# Patient Record
Sex: Male | Born: 1965 | Race: Black or African American | Hispanic: No | Marital: Single | Smoking: Never smoker
Health system: Southern US, Community
[De-identification: ages and names within clinical notes are randomized; demographics above are authoritative.]

---

## 2004-12-19 ENCOUNTER — Ambulatory Visit: Payer: Self-pay | Admitting: Unknown Physician Specialty

## 2007-05-22 ENCOUNTER — Emergency Department: Payer: Self-pay | Admitting: Emergency Medicine

## 2008-01-31 ENCOUNTER — Encounter: Admission: RE | Admit: 2008-01-31 | Discharge: 2008-01-31 | Payer: Self-pay | Admitting: Family Medicine

## 2009-02-20 ENCOUNTER — Emergency Department: Payer: Self-pay | Admitting: Emergency Medicine

## 2011-06-19 ENCOUNTER — Emergency Department: Payer: Self-pay | Admitting: Emergency Medicine

## 2011-07-01 ENCOUNTER — Ambulatory Visit: Payer: Self-pay | Admitting: Specialist

## 2012-06-19 IMAGING — CR DG SHOULDER 3+V*R*
1 series · 3 of 3 positions shown · non-contrast
Comparison: none

REASON FOR EXAM: pain x 6 months     Flex 5
COMMENTS:   LMP: (Male)

PROCEDURE:     DXR - DXR SHOULDER RIGHT COMPLETE  - June 19, 2011 [DATE]
RESULT:     Degenerative change present about the right shoulder.
Subacromial spurring is present. There is no evidence of fracture.

[Series 1: w shoulder external right · 0.14mm/px · 3 of 3 slices shown]
[im 1/3]
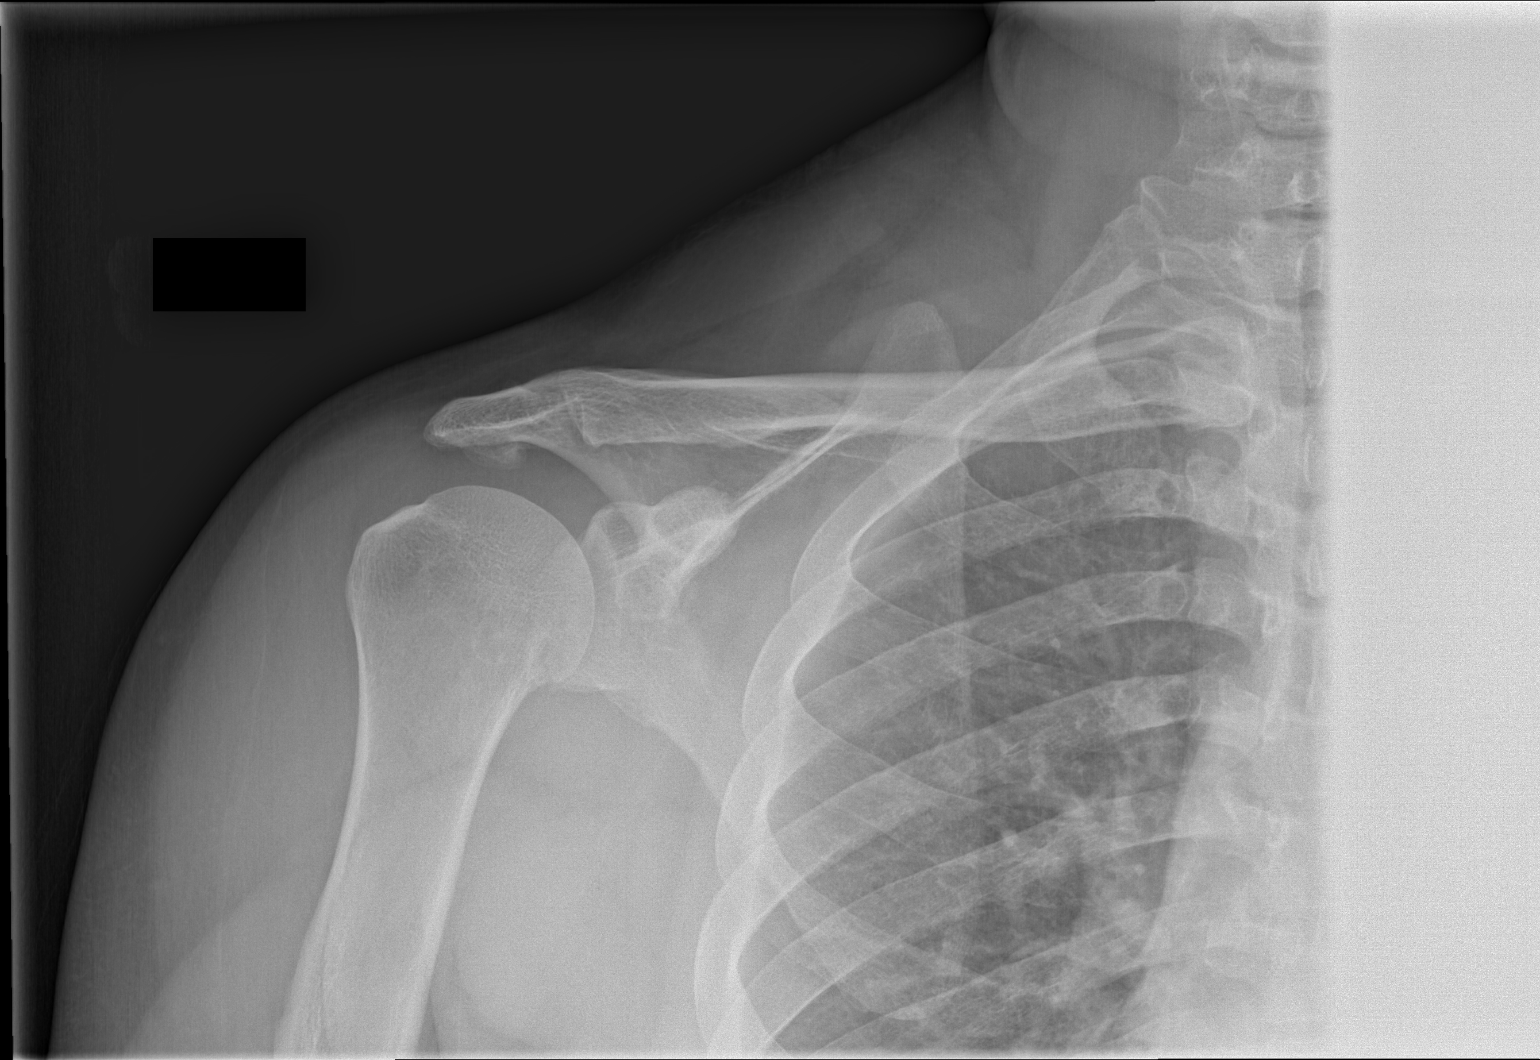
[im 2/3]
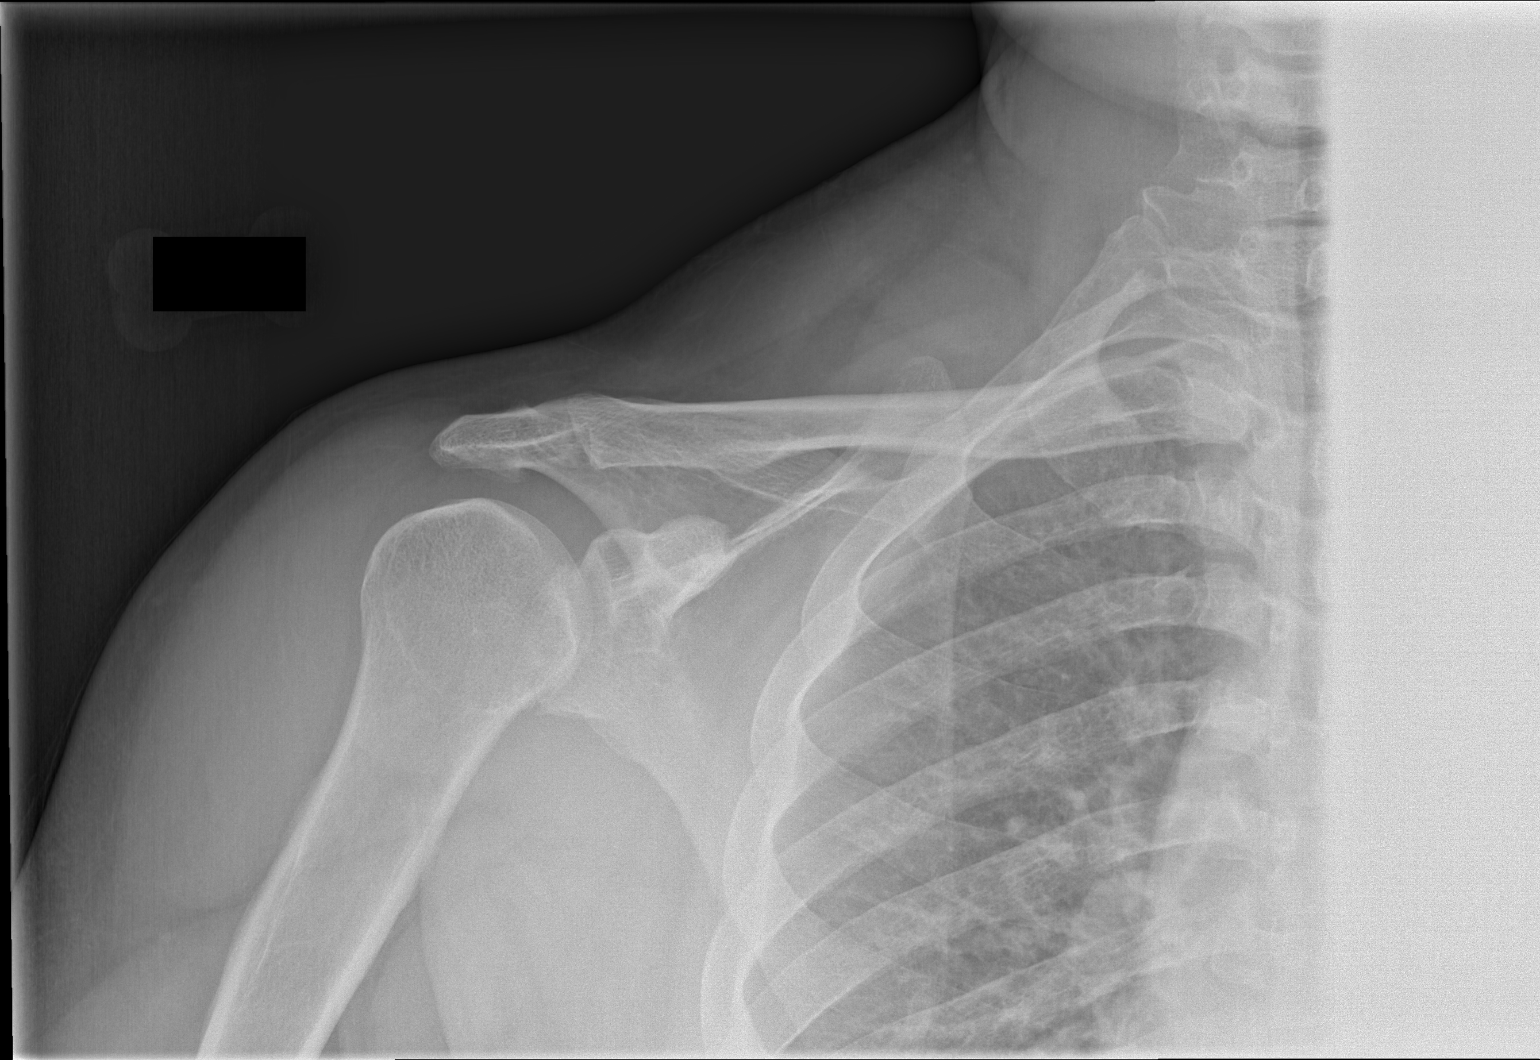
[im 3/3]
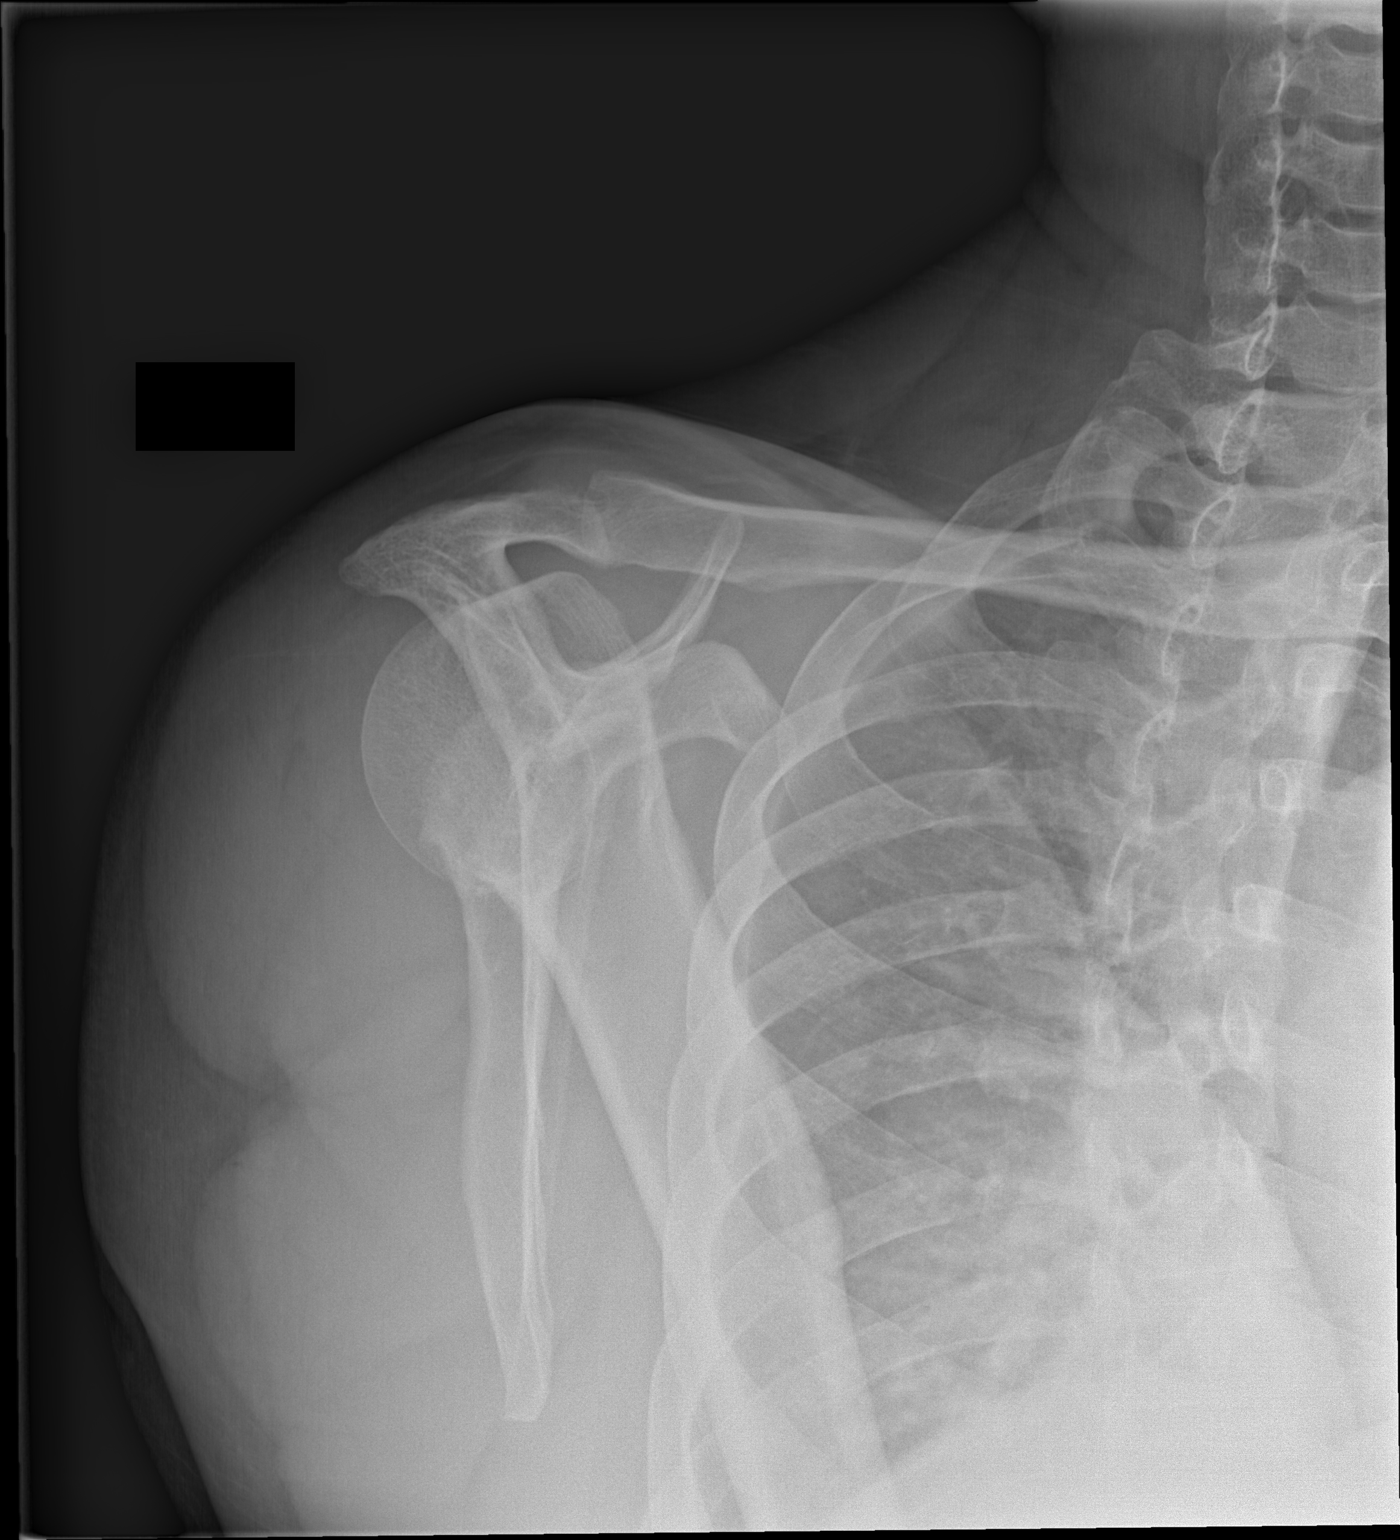

[3 of 3 positions shown; findings below may reference images not displayed]

IMPRESSION: 1. No acute abnormality.

## 2016-09-27 ENCOUNTER — Emergency Department
Admission: EM | Admit: 2016-09-27 | Discharge: 2016-09-27 | Disposition: A | Discharge status: Home / Self Care | Payer: Self-pay | Attending: Emergency Medicine | Admitting: Emergency Medicine

## 2016-09-27 ENCOUNTER — Encounter: Payer: Self-pay | Admitting: Emergency Medicine

## 2016-09-27 DIAGNOSIS — J111 Influenza due to unidentified influenza virus with other respiratory manifestations: Secondary | ICD-10-CM | POA: Insufficient documentation

## 2016-09-27 MED ORDER — OSELTAMIVIR PHOSPHATE 75 MG PO CAPS: 75.0000 mg | ORAL_CAPSULE | Freq: Two times a day (BID) | ORAL | 0 refills | Status: AC

## 2016-09-27 MED ORDER — GUAIFENESIN-CODEINE 100-10 MG/5ML PO SYRP: 5.0000 mL | ORAL_SOLUTION | Freq: Three times a day (TID) | ORAL | 0 refills | Status: AC | PRN

## 2016-09-27 MED ORDER — ACETAMINOPHEN 325 MG PO TABS
650.0000 mg | ORAL_TABLET | Freq: Once | ORAL | Status: AC
  Administered 2016-09-27: 650 mg via ORAL
  Filled 2016-09-27: qty 2

## 2016-09-27 NOTE — ED Triage Notes (Signed)
Pt reports cough that started on Friday productive at times, unknown if feverish.  Has been using OTC meds, none today.

## 2016-09-27 NOTE — ED Notes (Signed)

## 2016-09-27 NOTE — ED Provider Notes (Signed)
[facility] Emergency Department Provider Note  ____________________________________________  Time seen: Approximately 11:51 PM  I have reviewed the triage vital signs and the nursing notes.   HISTORY  Chief Complaint Cough and Back Pain    HPI Sergio Johns is a 51 y.o. male that presents to to the emergency department with nonproductive cough, rhinorrhea, body aches and fatigue for 2 days. Patient states that he has pain in his chest with coughing. Patient has not taken temp temperature and has not had chills. Patient denies shortness of breath, chest pain, nausea, vomiting, abdominal pain, diarrhea, constipation.   History reviewed. No pertinent past medical history.  There are no active problems to display for this patient.   History reviewed. No pertinent surgical history.  Prior to Admission medications   Medication Sig Start Date End Date Taking? Authorizing Provider  guaiFENesin-codeine (ROBITUSSIN AC) 100-10 MG/5ML syrup Take 5 mLs by mouth 3 (three) times daily as needed for cough. 09/27/16 09/29/16  Enid Derry, PA-C  oseltamivir (TAMIFLU) 75 MG capsule Take 1 capsule (75 mg total) by mouth 2 (two) times daily. 09/27/16 10/02/16  Enid Derry, PA-C    Allergies Aspirin  History reviewed. No pertinent family history.  Social History Social History  Substance Use Topics  . Smoking status: Never Smoker  . Smokeless tobacco: Never Used  . Alcohol use No     Review of Systems  Constitutional: No chills Eyes: No visual changes. No discharge. ENT: Positive for  rhinorrhea. Cardiovascular: No chest pain. Respiratory: Positive for cough. No SOB. Gastrointestinal: No abdominal pain.  No nausea, no vomiting.  No diarrhea.  No constipation. Musculoskeletal: Positive for musculoskeletal pain. Skin: Negative for rash, abrasions, lacerations, ecchymosis. Neurological: Negative for  headaches.   ____________________________________________   PHYSICAL EXAM:  VITAL SIGNS: ED Triage Vitals  Enc Vitals Group     BP 09/27/16 1815 (!) 148/104     Pulse Rate 09/27/16 1815 93     Resp 09/27/16 1815 18     Temp 09/27/16 1815 (!) 100.7 F (38.2 C)     Temp Source 09/27/16 1815 Oral     SpO2 09/27/16 1815 95 %     Weight 09/27/16 1815 250 lb (113.4 kg)     Height 09/27/16 1815 5\' 10"  (1.778 m)     Head Circumference --      Peak Flow --      Pain Score 09/27/16 1953 2     Pain Loc --      Pain Edu? --      Excl. in GC? --      Constitutional: Alert and oriented. Well appearing and in no acute distress. Eyes: Conjunctivae are normal. PERRL. EOMI. No discharge. Head: Atraumatic. ENT: No frontal and maxillary sinus tenderness.      Ears: Tympanic membranes pearly gray with good landmarks. No discharge.      Nose: Mild congestion/rhinnorhea.      Mouth/Throat: Mucous membranes are moist. Oropharynx non-erythematous. Tonsils not enlarged. No exudates. Uvula midline. Neck: No stridor.   Hematological/Lymphatic/Immunilogical: No cervical lymphadenopathy. Cardiovascular: Normal rate, regular rhythm.  Good peripheral circulation. Respiratory: Normal respiratory effort without tachypnea or retractions. Lungs CTAB. Good air entry to the bases with no decreased or absent breath sounds. Gastrointestinal: Bowel sounds 4 quadrants. Soft and nontender to palpation. No guarding or rigidity. No palpable masses. No distention. Musculoskeletal: Full range of motion to all extremities. No gross deformities appreciated. Neurologic:  Normal speech and language. No gross focal neurologic  deficits are appreciated.  Skin:  Skin is warm, dry and intact. No rash noted. Psychiatric: Mood and affect are normal. Speech and behavior are normal. Patient exhibits appropriate insight and judgement.   ____________________________________________   LABS (all labs ordered are listed, but only  abnormal results are displayed)  Labs Reviewed - No data to display ____________________________________________  EKG   ____________________________________________  RADIOLOGY   No results found.  ____________________________________________    PROCEDURES  Procedure(s) performed:    Procedures    Medications  acetaminophen (TYLENOL) tablet 650 mg (650 mg Oral Given 09/27/16 1953)     ____________________________________________   INITIAL IMPRESSION / ASSESSMENT AND PLAN / ED COURSE  Pertinent labs & imaging results that were available during my care of the patient were reviewed by me and considered in my medical decision making (see chart for details).  Review of the [place] CSRS was performed in accordance of the [facility] prior to dispensing any controlled drugs.     Patient's diagnosis is consistent with Influenza. Vital signs and exam are reassuring. He appears well in ED. Patient is comfortable to go home. Patient will be discharged home with prescriptions for Tamiflu and Robitussin. Patient is to follow up with PCP as needed or otherwise directed. Patient is given ED precautions to return to the ED for any worsening or new symptoms.   ____________________________________________  FINAL CLINICAL IMPRESSION(S) / ED DIAGNOSES  Final diagnoses:  Influenza      NEW MEDICATIONS STARTED DURING THIS VISIT:  Discharge Medication List as of 09/27/2016  7:11 PM    START taking these medications   Details  guaiFENesin-codeine (ROBITUSSIN AC) 100-10 MG/5ML syrup Take 5 mLs by mouth 3 (three) times daily as needed for cough., Starting Sun 09/27/2016, Until Tue 09/29/2016, Print    oseltamivir (TAMIFLU) 75 MG capsule Take 1 capsule (75 mg total) by mouth 2 (two) times daily., Starting Sun 09/27/2016, Until Fri 10/02/2016, Print            This chart was dictated using voice recognition software/Dragon. Despite best efforts to proofread, errors can occur which can change  the meaning. Any change was purely unintentional.    Enid DerryAshley Yuvan Medinger, PA-C 09/27/16 2357    Nita Sicklearolina Veronese, MD 09/29/16 [phone]

## 2016-09-27 NOTE — ED Notes (Signed)
Patient reports cough and body aches since Friday. Pt states that he is unsure if he has had fever. Patient states that if he takes deep breath in it causes him to cough. Patient has had clear nasal discharge. Patient also states that he is having bilateral rib pain.
